# Patient Record
Sex: Female | Born: 1945 | Race: White | Hispanic: No | Marital: Married | Smoking: Never smoker
Health system: Southern US, Community
[De-identification: ages and names within clinical notes are randomized; demographics above are authoritative.]

## PROBLEM LIST (undated history)

## (undated) DIAGNOSIS — E119 Type 2 diabetes mellitus without complications: Secondary | ICD-10-CM

## (undated) DIAGNOSIS — H8309 Labyrinthitis, unspecified ear: Secondary | ICD-10-CM

## (undated) DIAGNOSIS — E039 Hypothyroidism, unspecified: Secondary | ICD-10-CM

## (undated) DIAGNOSIS — I10 Essential (primary) hypertension: Secondary | ICD-10-CM

## (undated) DIAGNOSIS — G4730 Sleep apnea, unspecified: Secondary | ICD-10-CM

## (undated) DIAGNOSIS — K5790 Diverticulosis of intestine, part unspecified, without perforation or abscess without bleeding: Secondary | ICD-10-CM

## (undated) DIAGNOSIS — M810 Age-related osteoporosis without current pathological fracture: Secondary | ICD-10-CM

## (undated) HISTORY — PX: OTHER SURGICAL HISTORY: SHX169

## (undated) HISTORY — PX: TONSILLECTOMY: SUR1361

---

## 1971-12-23 HISTORY — PX: BREAST BIOPSY: SHX20

## 1994-12-22 HISTORY — PX: THYROIDECTOMY, PARTIAL: SHX18

## 2004-10-18 ENCOUNTER — Ambulatory Visit: Payer: Self-pay | Admitting: Gastroenterology

## 2005-09-27 ENCOUNTER — Emergency Department: Payer: Self-pay | Admitting: Emergency Medicine

## 2005-09-27 ENCOUNTER — Other Ambulatory Visit: Payer: Self-pay

## 2005-09-30 ENCOUNTER — Ambulatory Visit: Payer: Self-pay | Admitting: Unknown Physician Specialty

## 2006-07-24 ENCOUNTER — Ambulatory Visit: Payer: Self-pay | Admitting: Unknown Physician Specialty

## 2006-10-15 ENCOUNTER — Ambulatory Visit: Payer: Self-pay | Admitting: Unknown Physician Specialty

## 2007-10-19 ENCOUNTER — Ambulatory Visit: Payer: Self-pay | Admitting: Unknown Physician Specialty

## 2008-10-26 ENCOUNTER — Ambulatory Visit: Payer: Self-pay | Admitting: Unknown Physician Specialty

## 2008-11-03 ENCOUNTER — Ambulatory Visit: Payer: Self-pay | Admitting: Unknown Physician Specialty

## 2009-11-19 ENCOUNTER — Ambulatory Visit: Payer: Self-pay | Admitting: Unknown Physician Specialty

## 2010-10-21 ENCOUNTER — Ambulatory Visit: Payer: Self-pay | Admitting: Unknown Physician Specialty

## 2011-10-23 ENCOUNTER — Ambulatory Visit: Payer: Self-pay | Admitting: Unknown Physician Specialty

## 2012-10-25 ENCOUNTER — Ambulatory Visit: Payer: Self-pay | Admitting: Unknown Physician Specialty

## 2013-10-26 ENCOUNTER — Ambulatory Visit: Payer: Self-pay | Admitting: Internal Medicine

## 2014-12-04 ENCOUNTER — Ambulatory Visit: Payer: Self-pay | Admitting: Internal Medicine

## 2015-07-11 ENCOUNTER — Encounter: Payer: Self-pay | Admitting: *Deleted

## 2015-07-12 ENCOUNTER — Ambulatory Visit: Payer: PPO | Admitting: Anesthesiology

## 2015-07-12 ENCOUNTER — Ambulatory Visit
Admission: RE | Admit: 2015-07-12 | Discharge: 2015-07-12 | Disposition: A | Discharge status: Home / Self Care | Payer: PPO | Source: Ambulatory Visit | Attending: Unknown Physician Specialty | Admitting: Unknown Physician Specialty

## 2015-07-12 ENCOUNTER — Encounter
Admission: RE | Disposition: A | Discharge status: Home / Self Care | Payer: Self-pay | Source: Ambulatory Visit | Attending: Unknown Physician Specialty

## 2015-07-12 DIAGNOSIS — D122 Benign neoplasm of ascending colon: Secondary | ICD-10-CM | POA: Insufficient documentation

## 2015-07-12 DIAGNOSIS — K5730 Diverticulosis of large intestine without perforation or abscess without bleeding: Secondary | ICD-10-CM | POA: Insufficient documentation

## 2015-07-12 DIAGNOSIS — K640 First degree hemorrhoids: Secondary | ICD-10-CM | POA: Diagnosis not present

## 2015-07-12 DIAGNOSIS — Z86010 Personal history of colonic polyps: Secondary | ICD-10-CM | POA: Insufficient documentation

## 2015-07-12 DIAGNOSIS — I10 Essential (primary) hypertension: Secondary | ICD-10-CM | POA: Insufficient documentation

## 2015-07-12 DIAGNOSIS — G4730 Sleep apnea, unspecified: Secondary | ICD-10-CM | POA: Insufficient documentation

## 2015-07-12 DIAGNOSIS — E119 Type 2 diabetes mellitus without complications: Secondary | ICD-10-CM | POA: Diagnosis not present

## 2015-07-12 DIAGNOSIS — E039 Hypothyroidism, unspecified: Secondary | ICD-10-CM | POA: Diagnosis not present

## 2015-07-12 HISTORY — DX: Labyrinthitis, unspecified ear: H83.09

## 2015-07-12 HISTORY — DX: Sleep apnea, unspecified: G47.30

## 2015-07-12 HISTORY — PX: COLONOSCOPY WITH PROPOFOL: SHX5780

## 2015-07-12 HISTORY — DX: Diverticulosis of intestine, part unspecified, without perforation or abscess without bleeding: K57.90

## 2015-07-12 HISTORY — DX: Hypothyroidism, unspecified: E03.9

## 2015-07-12 HISTORY — DX: Type 2 diabetes mellitus without complications: E11.9

## 2015-07-12 HISTORY — DX: Age-related osteoporosis without current pathological fracture: M81.0

## 2015-07-12 HISTORY — DX: Essential (primary) hypertension: I10

## 2015-07-12 LAB — GLUCOSE, CAPILLARY: Glucose-Capillary: 171 mg/dL — ABNORMAL HIGH (ref 65–99)

## 2015-07-12 SURGERY — COLONOSCOPY WITH PROPOFOL
Anesthesia: General

## 2015-07-12 MED ORDER — PROPOFOL INFUSION 10 MG/ML OPTIME
INTRAVENOUS | Status: DC | PRN
  Administered 2015-07-12: 120 ug/kg/min via INTRAVENOUS

## 2015-07-12 MED ORDER — PROPOFOL 10 MG/ML IV BOLUS
INTRAVENOUS | Status: DC | PRN
  Administered 2015-07-12: 40 mg via INTRAVENOUS
  Administered 2015-07-12: 16 mg via INTRAVENOUS

## 2015-07-12 MED ORDER — EPHEDRINE SULFATE 50 MG/ML IJ SOLN
INTRAMUSCULAR | Status: DC | PRN
  Administered 2015-07-12: 5 mg via INTRAVENOUS
  Administered 2015-07-12: 10 mg via INTRAVENOUS

## 2015-07-12 MED ORDER — SODIUM CHLORIDE 0.9 % IV SOLN
INTRAVENOUS | Status: DC
Start: 2015-07-12 — End: 2015-07-12

## 2015-07-12 MED ORDER — FENTANYL CITRATE (PF) 100 MCG/2ML IJ SOLN
INTRAMUSCULAR | Status: DC | PRN
  Administered 2015-07-12: 50 ug via INTRAVENOUS

## 2015-07-12 MED ORDER — PHENYLEPHRINE HCL 10 MG/ML IJ SOLN
INTRAMUSCULAR | Status: DC | PRN
  Administered 2015-07-12 (×3): 100 ug via INTRAVENOUS

## 2015-07-12 MED ORDER — MIDAZOLAM HCL 5 MG/5ML IJ SOLN
INTRAMUSCULAR | Status: DC | PRN
  Administered 2015-07-12: 1 mg via INTRAVENOUS

## 2015-07-12 MED ORDER — SODIUM CHLORIDE 0.9 % IV SOLN
INTRAVENOUS | Status: DC
  Administered 2015-07-12: 1000 mL via INTRAVENOUS

## 2015-07-12 NOTE — Anesthesia Preprocedure Evaluation (Signed)
Anesthesia Evaluation  Patient identified by MRN, date of birth, ID band Patient awake    Reviewed: Allergy & Precautions, NPO status , Patient's Chart, lab work & pertinent test results  History of Anesthesia Complications Negative for: history of anesthetic complications  Airway Mallampati: III       Dental  (+) Teeth Intact   Pulmonary sleep apnea and Continuous Positive Airway Pressure Ventilation ,    Pulmonary exam normal       Cardiovascular hypertension, Pt. on medications Normal cardiovascular exam    Neuro/Psych    GI/Hepatic negative GI ROS,   Endo/Other  diabetes, Well Controlled, Type 2, Oral Hypoglycemic AgentsHypothyroidism   Renal/GU negative Renal ROS     Musculoskeletal   Abdominal Normal abdominal exam  (+)   Peds  Hematology   Anesthesia Other Findings   Reproductive/Obstetrics                             Anesthesia Physical Anesthesia Plan  ASA: III  Anesthesia Plan: General   Post-op Pain Management:    Induction: Intravenous  Airway Management Planned: Nasal Cannula  Additional Equipment:   Intra-op Plan:   Post-operative Plan:   Informed Consent: I have reviewed the patients History and Physical, chart, labs and discussed the procedure including the risks, benefits and alternatives for the proposed anesthesia with the patient or authorized representative who has indicated his/her understanding and acceptance.     Plan Discussed with: CRNA  Anesthesia Plan Comments:         Anesthesia Quick Evaluation

## 2015-07-12 NOTE — Op Note (Signed)
[facility] Patient Name: Jaime Silva Procedure Date: 07/12/2015 7:55 AM MRN: [id number] Account #: [id number] Date of Birth: [birth year] Admit Type: Outpatient Age: 69 Room: [facility] ROOM 1 Gender: Female Note Status: Finalized Procedure:         Colonoscopy Indications:       Personal history of colonic polyps Providers:         Manya Silvas, MD Referring MD:      Glendon Axe (Referring MD) Medicines:         Propofol per Anesthesia Complications:     No immediate complications. Procedure:         Pre-Anesthesia Assessment:                    - After reviewing the risks and benefits, the patient was                     deemed in satisfactory condition to undergo the procedure.                    After obtaining informed consent, the colonoscope was                     passed under direct vision. Throughout the procedure, the                     patient's blood pressure, pulse, and oxygen saturations                     were monitored continuously. The Olympus PCF-H180AL                     colonoscope ( S#: [id number] ) was introduced through the                     anus and advanced to the the cecum, identified by                     appendiceal orifice and ileocecal valve. Findings:      A diminutive polyp was found in the proximal ascending colon. The polyp       was sessile. The polyp was removed with a jumbo cold forceps. Resection       and retrieval were complete.      Many small-mouthed diverticula were found in the sigmoid colon and in       the descending colon.      Internal hemorrhoids were found during endoscopy. The hemorrhoids were       small, medium-sized and Grade I (internal hemorrhoids that do not       prolapse).      The exam was otherwise without abnormality. Impression:        - One diminutive polyp in the proximal ascending colon.                     Resected and retrieved.                    - Diverticulosis  in the sigmoid colon and in the                     descending colon.                    - Internal hemorrhoids.                    -  The examination was otherwise normal. Recommendation:    - Await pathology results. Manya Silvas, MD 07/12/2015 8:35:03 AM This report has been signed electronically. Number of Addenda: 0 Note Initiated On: 07/12/2015 7:55 AM Scope Withdrawal Time: 0 hours 11 minutes 42 seconds  Total Procedure Duration: 0 hours 17 minutes 49 seconds       [facility]

## 2015-07-12 NOTE — Transfer of Care (Signed)
Immediate Anesthesia Transfer of Care Note  Patient: Jaime Silva  Procedure(s) Performed: Procedure(s): COLONOSCOPY WITH PROPOFOL (N/A)  Patient Location: PACU  Anesthesia Type:General  Level of Consciousness: awake, alert , oriented and patient cooperative  Airway & Oxygen Therapy: Patient Spontanous Breathing and Patient connected to nasal cannula oxygen  Post-op Assessment: Report given to RN and Post -op Vital signs reviewed and stable  Post vital signs: Reviewed and stable  Last Vitals:  Filed Vitals:   07/12/15 0838  BP: 104/59  Pulse:   Temp: 36 C  Resp: 18    Complications: No apparent anesthesia complications

## 2015-07-12 NOTE — Anesthesia Postprocedure Evaluation (Signed)
  Anesthesia Post-op Note  Patient: Jaime Silva  Procedure(s) Performed: Procedure(s): COLONOSCOPY WITH PROPOFOL (N/A)  Anesthesia type:General  Patient location: PACU  Post pain: Pain level controlled  Post assessment: Post-op Vital signs reviewed, Patient's Cardiovascular Status Stable, Respiratory Function Stable, Patent Airway and No signs of Nausea or vomiting  Post vital signs: Reviewed and stable  Last Vitals:  Filed Vitals:   07/12/15 0900  BP: 98/67  Pulse: 73  Temp:   Resp: 19    Level of consciousness: awake, alert  and patient cooperative  Complications: No apparent anesthesia complications

## 2015-07-12 NOTE — H&P (Signed)
Primary Care Physician:  Glendon Axe, MD Primary Gastroenterologist:  Dr. Vira Agar  Pre-Procedure History & Physical: HPI:  GURNOOR SLOOP is a 69 y.o. female is here for an colonoscopy.   Past Medical History  Diagnosis Date  . Hypothyroidism   . Hypertension   . Sleep apnea   . Diabetes mellitus without complication   . Labyrinthitis   . Osteoporosis   . Diverticulosis     Past Surgical History  Procedure Laterality Date  . Tonsillectomy    . Fatty tumor removal    . Thyroidectomy, partial  1996  . Septoplasty and turbinate reduction      Prior to Admission medications   Medication Sig Start Date End Date Taking? Authorizing Provider  b complex vitamins tablet Take 1 tablet by mouth daily.   Yes Historical Provider, MD  cloNIDine (CATAPRES) 0.2 MG tablet Take 0.2 mg by mouth 2 (two) times daily.   Yes Historical Provider, MD  glucosamine-chondroitin 500-400 MG tablet Take 1 tablet by mouth 3 (three) times daily.   Yes Historical Provider, MD  levothyroxine (SYNTHROID, LEVOTHROID) 112 MCG tablet Take 112 mcg by mouth daily before breakfast.   Yes Historical Provider, MD  meclizine (ANTIVERT) 12.5 MG tablet Take 12.5 mg by mouth 3 (three) times daily as needed for dizziness.   Yes Historical Provider, MD  metFORMIN (GLUCOPHAGE) 1000 MG tablet Take 1,000 mg by mouth 2 (two) times daily with a meal.   Yes Historical Provider, MD  omega-3 acid ethyl esters (LOVAZA) 1 G capsule Take by mouth 2 (two) times daily.   Yes Historical Provider, MD  raloxifene (EVISTA) 60 MG tablet Take 60 mg by mouth daily.   Yes Historical Provider, MD  senna (SENOKOT) 8.6 MG tablet Take 2 tablets by mouth daily.   Yes Historical Provider, MD  valsartan-hydrochlorothiazide (DIOVAN-HCT) 320-12.5 MG per tablet Take 1 tablet by mouth daily.   Yes Historical Provider, MD  vitamin B-12 (CYANOCOBALAMIN) 1000 MCG tablet 1,000 mcg daily.   Yes Historical Provider, MD    Allergies as of 06/19/2015  .  (Not on File)    History reviewed. No pertinent family history.  History   Social History  . Marital Status: Married    Spouse Name: N/A  . Number of Children: N/A  . Years of Education: N/A   Occupational History  . Not on file.   Social History Main Topics  . Smoking status: Never Smoker   . Smokeless tobacco: Not on file  . Alcohol Use: No  . Drug Use: No  . Sexual Activity: Not on file   Other Topics Concern  . Not on file   Social History Narrative    Review of Systems: See HPI, otherwise negative ROS  Physical Exam: BP 128/88 mmHg  Pulse 79  Temp(Src) 97.9 F (36.6 C) (Tympanic)  Resp 17  Ht 5' 4.75" (1.645 m)  Wt 90.719 kg (200 lb)  BMI 33.52 kg/m2  SpO2 97% General:   Alert,  pleasant and cooperative in NAD Head:  Normocephalic and atraumatic. Neck:  Supple; no masses or thyromegaly. Lungs:  Clear throughout to auscultation.    Heart:  Regular rate and rhythm. Abdomen:  Soft, nontender and nondistended. Normal bowel sounds, without guarding, and without rebound.   Neurologic:  Alert and  oriented x4;  grossly normal neurologically.  Impression/Plan: DEYJAH KINDEL is here for an colonoscopy to be performed for personal history of colon polyps  Risks, benefits, limitations, and alternatives regarding  colonoscopy  have been reviewed with the patient.  Questions have been answered.  All parties agreeable.   Gaylyn Cheers, MD  07/12/2015, 8:05 AM   Primary Care Physician:  Glendon Axe, MD Primary Gastroenterologist:  Dr. Vira Agar  Pre-Procedure History & Physical: HPI:  SAN RUA is a 69 y.o. female is here for an colonoscopy.   Past Medical History  Diagnosis Date  . Hypothyroidism   . Hypertension   . Sleep apnea   . Diabetes mellitus without complication   . Labyrinthitis   . Osteoporosis   . Diverticulosis     Past Surgical History  Procedure Laterality Date  . Tonsillectomy    . Fatty tumor removal    . Thyroidectomy,  partial  1996  . Septoplasty and turbinate reduction      Prior to Admission medications   Medication Sig Start Date End Date Taking? Authorizing Provider  b complex vitamins tablet Take 1 tablet by mouth daily.   Yes Historical Provider, MD  cloNIDine (CATAPRES) 0.2 MG tablet Take 0.2 mg by mouth 2 (two) times daily.   Yes Historical Provider, MD  glucosamine-chondroitin 500-400 MG tablet Take 1 tablet by mouth 3 (three) times daily.   Yes Historical Provider, MD  levothyroxine (SYNTHROID, LEVOTHROID) 112 MCG tablet Take 112 mcg by mouth daily before breakfast.   Yes Historical Provider, MD  meclizine (ANTIVERT) 12.5 MG tablet Take 12.5 mg by mouth 3 (three) times daily as needed for dizziness.   Yes Historical Provider, MD  metFORMIN (GLUCOPHAGE) 1000 MG tablet Take 1,000 mg by mouth 2 (two) times daily with a meal.   Yes Historical Provider, MD  omega-3 acid ethyl esters (LOVAZA) 1 G capsule Take by mouth 2 (two) times daily.   Yes Historical Provider, MD  raloxifene (EVISTA) 60 MG tablet Take 60 mg by mouth daily.   Yes Historical Provider, MD  senna (SENOKOT) 8.6 MG tablet Take 2 tablets by mouth daily.   Yes Historical Provider, MD  valsartan-hydrochlorothiazide (DIOVAN-HCT) 320-12.5 MG per tablet Take 1 tablet by mouth daily.   Yes Historical Provider, MD  vitamin B-12 (CYANOCOBALAMIN) 1000 MCG tablet 1,000 mcg daily.   Yes Historical Provider, MD    Allergies as of 06/19/2015  . (Not on File)    History reviewed. No pertinent family history.  History   Social History  . Marital Status: Married    Spouse Name: N/A  . Number of Children: N/A  . Years of Education: N/A   Occupational History  . Not on file.   Social History Main Topics  . Smoking status: Never Smoker   . Smokeless tobacco: Not on file  . Alcohol Use: No  . Drug Use: No  . Sexual Activity: Not on file   Other Topics Concern  . Not on file   Social History Narrative    Review of Systems: See HPI,  otherwise negative ROS  Physical Exam: BP 128/88 mmHg  Pulse 79  Temp(Src) 97.9 F (36.6 C) (Tympanic)  Resp 17  Ht 5' 4.75" (1.645 m)  Wt 90.719 kg (200 lb)  BMI 33.52 kg/m2  SpO2 97% General:   Alert,  pleasant and cooperative in NAD Head:  Normocephalic and atraumatic. Neck:  Supple; no masses or thyromegaly. Lungs:  Clear throughout to auscultation.    Heart:  Regular rate and rhythm. Abdomen:  Soft, nontender and nondistended. Normal bowel sounds, without guarding, and without rebound.   Neurologic:  Alert and  oriented x4;  grossly normal neurologically.  Impression/Plan:  TALAH COOKSTON is here for an colonoscopy to be performed for personal history of colon polyps   Risks, benefits, limitations, and alternatives regarding  colonoscopy have been reviewed with the patient.  Questions have been answered.  All parties agreeable.   Gaylyn Cheers, MD  07/12/2015, 8:05 AM   Primary Care Physician:  Glendon Axe, MD Primary Gastroenterologist:  Dr. Vira Agar  Pre-Procedure History & Physical: HPI:  AMMIE WARRICK is a 70 y.o. female is here for an colonoscopy.   Past Medical History  Diagnosis Date  . Hypothyroidism   . Hypertension   . Sleep apnea   . Diabetes mellitus without complication   . Labyrinthitis   . Osteoporosis   . Diverticulosis     Past Surgical History  Procedure Laterality Date  . Tonsillectomy    . Fatty tumor removal    . Thyroidectomy, partial  1996  . Septoplasty and turbinate reduction      Prior to Admission medications   Medication Sig Start Date End Date Taking? Authorizing Provider  b complex vitamins tablet Take 1 tablet by mouth daily.   Yes Historical Provider, MD  cloNIDine (CATAPRES) 0.2 MG tablet Take 0.2 mg by mouth 2 (two) times daily.   Yes Historical Provider, MD  glucosamine-chondroitin 500-400 MG tablet Take 1 tablet by mouth 3 (three) times daily.   Yes Historical Provider, MD  levothyroxine (SYNTHROID, LEVOTHROID)  112 MCG tablet Take 112 mcg by mouth daily before breakfast.   Yes Historical Provider, MD  meclizine (ANTIVERT) 12.5 MG tablet Take 12.5 mg by mouth 3 (three) times daily as needed for dizziness.   Yes Historical Provider, MD  metFORMIN (GLUCOPHAGE) 1000 MG tablet Take 1,000 mg by mouth 2 (two) times daily with a meal.   Yes Historical Provider, MD  omega-3 acid ethyl esters (LOVAZA) 1 G capsule Take by mouth 2 (two) times daily.   Yes Historical Provider, MD  raloxifene (EVISTA) 60 MG tablet Take 60 mg by mouth daily.   Yes Historical Provider, MD  senna (SENOKOT) 8.6 MG tablet Take 2 tablets by mouth daily.   Yes Historical Provider, MD  valsartan-hydrochlorothiazide (DIOVAN-HCT) 320-12.5 MG per tablet Take 1 tablet by mouth daily.   Yes Historical Provider, MD  vitamin B-12 (CYANOCOBALAMIN) 1000 MCG tablet 1,000 mcg daily.   Yes Historical Provider, MD    Allergies as of 06/19/2015  . (Not on File)    History reviewed. No pertinent family history.  History   Social History  . Marital Status: Married    Spouse Name: N/A  . Number of Children: N/A  . Years of Education: N/A   Occupational History  . Not on file.   Social History Main Topics  . Smoking status: Never Smoker   . Smokeless tobacco: Not on file  . Alcohol Use: No  . Drug Use: No  . Sexual Activity: Not on file   Other Topics Concern  . Not on file   Social History Narrative    Review of Systems: See HPI, otherwise negative ROS  Physical Exam: BP 128/88 mmHg  Pulse 79  Temp(Src) 97.9 F (36.6 C) (Tympanic)  Resp 17  Ht 5' 4.75" (1.645 m)  Wt 90.719 kg (200 lb)  BMI 33.52 kg/m2  SpO2 97% General:   Alert,  pleasant and cooperative in NAD Head:  Normocephalic and atraumatic. Neck:  Supple; no masses or thyromegaly. Lungs:  Clear throughout to auscultation.    Heart:  Regular rate and rhythm. Abdomen:  Soft,  nontender and nondistended. Normal bowel sounds, without guarding, and without rebound.    Neurologic:  Alert and  oriented x4;  grossly normal neurologically.  Impression/Plan: AZIYAH PROVENCAL is here for an colonoscopy to be performed for personal hx colon polyps  Risks, benefits, limitations, and alternatives regarding  colonoscopy have been reviewed with the patient.  Questions have been answered.  All parties agreeable.   Gaylyn Cheers, MD  07/12/2015, 8:05 AM   Primary Care Physician:  Glendon Axe, MD Primary Gastroenterologist:  Dr. Vira Agar  Pre-Procedure History & Physical: HPI:  YANNI RUBERG is a 69 y.o. female is here for an colonoscopy.   Past Medical History  Diagnosis Date  . Hypothyroidism   . Hypertension   . Sleep apnea   . Diabetes mellitus without complication   . Labyrinthitis   . Osteoporosis   . Diverticulosis     Past Surgical History  Procedure Laterality Date  . Tonsillectomy    . Fatty tumor removal    . Thyroidectomy, partial  1996  . Septoplasty and turbinate reduction      Prior to Admission medications   Medication Sig Start Date End Date Taking? Authorizing Provider  b complex vitamins tablet Take 1 tablet by mouth daily.   Yes Historical Provider, MD  cloNIDine (CATAPRES) 0.2 MG tablet Take 0.2 mg by mouth 2 (two) times daily.   Yes Historical Provider, MD  glucosamine-chondroitin 500-400 MG tablet Take 1 tablet by mouth 3 (three) times daily.   Yes Historical Provider, MD  levothyroxine (SYNTHROID, LEVOTHROID) 112 MCG tablet Take 112 mcg by mouth daily before breakfast.   Yes Historical Provider, MD  meclizine (ANTIVERT) 12.5 MG tablet Take 12.5 mg by mouth 3 (three) times daily as needed for dizziness.   Yes Historical Provider, MD  metFORMIN (GLUCOPHAGE) 1000 MG tablet Take 1,000 mg by mouth 2 (two) times daily with a meal.   Yes Historical Provider, MD  omega-3 acid ethyl esters (LOVAZA) 1 G capsule Take by mouth 2 (two) times daily.   Yes Historical Provider, MD  raloxifene (EVISTA) 60 MG tablet Take 60 mg by mouth  daily.   Yes Historical Provider, MD  senna (SENOKOT) 8.6 MG tablet Take 2 tablets by mouth daily.   Yes Historical Provider, MD  valsartan-hydrochlorothiazide (DIOVAN-HCT) 320-12.5 MG per tablet Take 1 tablet by mouth daily.   Yes Historical Provider, MD  vitamin B-12 (CYANOCOBALAMIN) 1000 MCG tablet 1,000 mcg daily.   Yes Historical Provider, MD    Allergies as of 06/19/2015  . (Not on File)    History reviewed. No pertinent family history.  History   Social History  . Marital Status: Married    Spouse Name: N/A  . Number of Children: N/A  . Years of Education: N/A   Occupational History  . Not on file.   Social History Main Topics  . Smoking status: Never Smoker   . Smokeless tobacco: Not on file  . Alcohol Use: No  . Drug Use: No  . Sexual Activity: Not on file   Other Topics Concern  . Not on file   Social History Narrative    Review of Systems: See HPI, otherwise negative ROS  Physical Exam: BP 128/88 mmHg  Pulse 79  Temp(Src) 97.9 F (36.6 C) (Tympanic)  Resp 17  Ht 5' 4.75" (1.645 m)  Wt 90.719 kg (200 lb)  BMI 33.52 kg/m2  SpO2 97% General:   Alert,  pleasant and cooperative in NAD Head:  Normocephalic and atraumatic.  Neck:  Supple; no masses or thyromegaly. Lungs:  Clear throughout to auscultation.    Heart:  Regular rate and rhythm. Abdomen:  Soft, nontender and nondistended. Normal bowel sounds, without guarding, and without rebound.   Neurologic:  Alert and  oriented x4;  grossly normal neurologically.  Impression/Plan: BUFFY EHLER is here for an colonoscopy to be performed for personal hx colon polyps  Risks, benefits, limitations, and alternatives regarding  colonoscopy have been reviewed with the patient.  Questions have been answered.  All parties agreeable.   Gaylyn Cheers, MD  07/12/2015, 8:05 AM

## 2015-07-13 LAB — SURGICAL PATHOLOGY

## 2015-07-16 ENCOUNTER — Encounter: Payer: Self-pay | Admitting: Unknown Physician Specialty

## 2015-11-30 ENCOUNTER — Other Ambulatory Visit: Payer: Self-pay | Admitting: Internal Medicine

## 2015-11-30 DIAGNOSIS — Z1239 Encounter for other screening for malignant neoplasm of breast: Secondary | ICD-10-CM

## 2015-12-11 ENCOUNTER — Ambulatory Visit
Admission: RE | Admit: 2015-12-11 | Discharge: 2015-12-11 | Disposition: A | Discharge status: Home / Self Care | Payer: PPO | Source: Ambulatory Visit | Attending: Internal Medicine | Admitting: Internal Medicine

## 2015-12-11 DIAGNOSIS — Z1231 Encounter for screening mammogram for malignant neoplasm of breast: Secondary | ICD-10-CM | POA: Diagnosis present

## 2015-12-11 DIAGNOSIS — Z1239 Encounter for other screening for malignant neoplasm of breast: Secondary | ICD-10-CM

## 2016-03-05 DIAGNOSIS — E538 Deficiency of other specified B group vitamins: Secondary | ICD-10-CM | POA: Diagnosis not present

## 2016-03-05 DIAGNOSIS — I10 Essential (primary) hypertension: Secondary | ICD-10-CM | POA: Diagnosis not present

## 2016-03-05 DIAGNOSIS — E119 Type 2 diabetes mellitus without complications: Secondary | ICD-10-CM | POA: Diagnosis not present

## 2016-03-10 DIAGNOSIS — E119 Type 2 diabetes mellitus without complications: Secondary | ICD-10-CM | POA: Diagnosis not present

## 2016-07-24 DIAGNOSIS — L259 Unspecified contact dermatitis, unspecified cause: Secondary | ICD-10-CM | POA: Diagnosis not present

## 2016-09-29 DIAGNOSIS — E119 Type 2 diabetes mellitus without complications: Secondary | ICD-10-CM | POA: Diagnosis not present

## 2016-11-25 DIAGNOSIS — E119 Type 2 diabetes mellitus without complications: Secondary | ICD-10-CM | POA: Diagnosis not present

## 2016-12-01 ENCOUNTER — Other Ambulatory Visit: Payer: Self-pay | Admitting: Internal Medicine

## 2016-12-01 DIAGNOSIS — Z1239 Encounter for other screening for malignant neoplasm of breast: Secondary | ICD-10-CM

## 2016-12-01 DIAGNOSIS — I10 Essential (primary) hypertension: Secondary | ICD-10-CM | POA: Diagnosis not present

## 2016-12-01 DIAGNOSIS — E119 Type 2 diabetes mellitus without complications: Secondary | ICD-10-CM | POA: Diagnosis not present

## 2016-12-01 DIAGNOSIS — Z1231 Encounter for screening mammogram for malignant neoplasm of breast: Secondary | ICD-10-CM | POA: Diagnosis not present

## 2016-12-01 DIAGNOSIS — Z780 Asymptomatic menopausal state: Secondary | ICD-10-CM | POA: Diagnosis not present

## 2016-12-01 DIAGNOSIS — Z23 Encounter for immunization: Secondary | ICD-10-CM | POA: Diagnosis not present

## 2016-12-01 DIAGNOSIS — E039 Hypothyroidism, unspecified: Secondary | ICD-10-CM | POA: Diagnosis not present

## 2016-12-01 DIAGNOSIS — Z0000 Encounter for general adult medical examination without abnormal findings: Secondary | ICD-10-CM | POA: Diagnosis not present

## 2016-12-17 DIAGNOSIS — Z780 Asymptomatic menopausal state: Secondary | ICD-10-CM | POA: Diagnosis not present

## 2017-01-06 ENCOUNTER — Ambulatory Visit
Admission: RE | Admit: 2017-01-06 | Discharge: 2017-01-06 | Disposition: A | Discharge status: Home / Self Care | Payer: PPO | Source: Ambulatory Visit | Attending: Internal Medicine | Admitting: Internal Medicine

## 2017-01-06 DIAGNOSIS — Z1231 Encounter for screening mammogram for malignant neoplasm of breast: Secondary | ICD-10-CM | POA: Diagnosis not present

## 2017-01-06 DIAGNOSIS — Z1239 Encounter for other screening for malignant neoplasm of breast: Secondary | ICD-10-CM

## 2017-06-18 DIAGNOSIS — E669 Obesity, unspecified: Secondary | ICD-10-CM | POA: Diagnosis not present

## 2017-06-18 DIAGNOSIS — M25562 Pain in left knee: Secondary | ICD-10-CM | POA: Diagnosis not present

## 2017-06-18 DIAGNOSIS — M170 Bilateral primary osteoarthritis of knee: Secondary | ICD-10-CM | POA: Diagnosis not present

## 2017-06-18 DIAGNOSIS — E119 Type 2 diabetes mellitus without complications: Secondary | ICD-10-CM | POA: Diagnosis not present

## 2017-06-18 DIAGNOSIS — M25561 Pain in right knee: Secondary | ICD-10-CM | POA: Diagnosis not present

## 2017-06-18 DIAGNOSIS — G8929 Other chronic pain: Secondary | ICD-10-CM | POA: Diagnosis not present

## 2017-06-25 DIAGNOSIS — E119 Type 2 diabetes mellitus without complications: Secondary | ICD-10-CM | POA: Diagnosis not present

## 2017-06-25 DIAGNOSIS — Z0000 Encounter for general adult medical examination without abnormal findings: Secondary | ICD-10-CM | POA: Diagnosis not present

## 2017-06-25 DIAGNOSIS — Z23 Encounter for immunization: Secondary | ICD-10-CM | POA: Diagnosis not present

## 2017-06-25 DIAGNOSIS — I10 Essential (primary) hypertension: Secondary | ICD-10-CM | POA: Diagnosis not present

## 2017-06-25 DIAGNOSIS — E039 Hypothyroidism, unspecified: Secondary | ICD-10-CM | POA: Diagnosis not present

## 2017-09-10 DIAGNOSIS — M170 Bilateral primary osteoarthritis of knee: Secondary | ICD-10-CM | POA: Diagnosis not present

## 2017-09-22 DIAGNOSIS — M1712 Unilateral primary osteoarthritis, left knee: Secondary | ICD-10-CM | POA: Diagnosis not present

## 2017-11-18 DIAGNOSIS — E119 Type 2 diabetes mellitus without complications: Secondary | ICD-10-CM | POA: Diagnosis not present

## 2017-11-18 DIAGNOSIS — I10 Essential (primary) hypertension: Secondary | ICD-10-CM | POA: Diagnosis not present

## 2017-11-18 DIAGNOSIS — E039 Hypothyroidism, unspecified: Secondary | ICD-10-CM | POA: Diagnosis not present

## 2018-03-02 ENCOUNTER — Other Ambulatory Visit: Payer: Self-pay | Admitting: Internal Medicine

## 2018-03-02 DIAGNOSIS — Z1231 Encounter for screening mammogram for malignant neoplasm of breast: Secondary | ICD-10-CM

## 2018-03-18 ENCOUNTER — Ambulatory Visit
Admission: RE | Admit: 2018-03-18 | Discharge: 2018-03-18 | Disposition: A | Discharge status: Home / Self Care | Payer: Medicare HMO | Source: Ambulatory Visit | Attending: Internal Medicine | Admitting: Internal Medicine

## 2018-03-18 DIAGNOSIS — Z1231 Encounter for screening mammogram for malignant neoplasm of breast: Secondary | ICD-10-CM | POA: Diagnosis not present

## 2018-07-22 ENCOUNTER — Encounter: Admission: RE | Admit: 2018-07-22 | Payer: PPO | Source: Ambulatory Visit | Admitting: Internal Medicine

## 2019-02-07 ENCOUNTER — Other Ambulatory Visit: Payer: Self-pay | Admitting: Internal Medicine

## 2019-02-07 DIAGNOSIS — Z1231 Encounter for screening mammogram for malignant neoplasm of breast: Secondary | ICD-10-CM

## 2019-06-22 DEATH — deceased

## 2019-12-19 ENCOUNTER — Other Ambulatory Visit: Payer: Self-pay | Admitting: Internal Medicine

## 2019-12-19 DIAGNOSIS — Z1231 Encounter for screening mammogram for malignant neoplasm of breast: Secondary | ICD-10-CM

## 2019-12-21 ENCOUNTER — Ambulatory Visit: Payer: PPO

## 2020-04-18 ENCOUNTER — Ambulatory Visit
Admission: RE | Admit: 2020-04-18 | Discharge: 2020-04-18 | Disposition: A | Discharge status: Home / Self Care | Payer: Medicare HMO | Source: Ambulatory Visit | Attending: Internal Medicine | Admitting: Internal Medicine

## 2020-04-18 DIAGNOSIS — Z1231 Encounter for screening mammogram for malignant neoplasm of breast: Secondary | ICD-10-CM

## 2021-06-21 ENCOUNTER — Emergency Department
Admission: EM | Admit: 2021-06-21 | Discharge: 2021-06-21 | Disposition: A | Discharge status: Home / Self Care | Payer: Medicare HMO | Attending: Student in an Organized Health Care Education/Training Program | Admitting: Student in an Organized Health Care Education/Training Program

## 2021-06-21 ENCOUNTER — Emergency Department: Payer: Medicare HMO

## 2021-06-21 ENCOUNTER — Other Ambulatory Visit: Payer: Self-pay

## 2021-06-21 DIAGNOSIS — Z8589 Personal history of malignant neoplasm of other organs and systems: Secondary | ICD-10-CM | POA: Diagnosis not present

## 2021-06-21 DIAGNOSIS — Z7984 Long term (current) use of oral hypoglycemic drugs: Secondary | ICD-10-CM | POA: Insufficient documentation

## 2021-06-21 DIAGNOSIS — Z79899 Other long term (current) drug therapy: Secondary | ICD-10-CM | POA: Diagnosis not present

## 2021-06-21 DIAGNOSIS — K5732 Diverticulitis of large intestine without perforation or abscess without bleeding: Secondary | ICD-10-CM | POA: Diagnosis not present

## 2021-06-21 DIAGNOSIS — K5792 Diverticulitis of intestine, part unspecified, without perforation or abscess without bleeding: Secondary | ICD-10-CM

## 2021-06-21 DIAGNOSIS — I10 Essential (primary) hypertension: Secondary | ICD-10-CM | POA: Diagnosis not present

## 2021-06-21 DIAGNOSIS — E039 Hypothyroidism, unspecified: Secondary | ICD-10-CM | POA: Diagnosis not present

## 2021-06-21 DIAGNOSIS — R1030 Lower abdominal pain, unspecified: Secondary | ICD-10-CM | POA: Diagnosis present

## 2021-06-21 DIAGNOSIS — E119 Type 2 diabetes mellitus without complications: Secondary | ICD-10-CM | POA: Diagnosis not present

## 2021-06-21 LAB — CBC
HCT: 36.9 % (ref 36.0–46.0)
Hemoglobin: 11.9 g/dL — ABNORMAL LOW (ref 12.0–15.0)
MCH: 29.2 pg (ref 26.0–34.0)
MCHC: 32.2 g/dL (ref 30.0–36.0)
MCV: 90.7 fL (ref 80.0–100.0)
Platelets: 266 10*3/uL (ref 150–400)
RBC: 4.07 MIL/uL (ref 3.87–5.11)
RDW: 13.3 % (ref 11.5–15.5)
WBC: 12.6 10*3/uL — ABNORMAL HIGH (ref 4.0–10.5)
nRBC: 0 % (ref 0.0–0.2)

## 2021-06-21 LAB — URINALYSIS, COMPLETE (UACMP) WITH MICROSCOPIC
Bacteria, UA: NONE SEEN
Bilirubin Urine: NEGATIVE
Glucose, UA: NEGATIVE mg/dL
Hgb urine dipstick: NEGATIVE
Ketones, ur: NEGATIVE mg/dL
Leukocytes,Ua: NEGATIVE
Nitrite: NEGATIVE
Protein, ur: NEGATIVE mg/dL
Specific Gravity, Urine: 1.015 (ref 1.005–1.030)
pH: 5 (ref 5.0–8.0)

## 2021-06-21 LAB — BASIC METABOLIC PANEL
Anion gap: 9 (ref 5–15)
BUN: 14 mg/dL (ref 8–23)
CO2: 25 mmol/L (ref 22–32)
Calcium: 9.5 mg/dL (ref 8.9–10.3)
Chloride: 102 mmol/L (ref 98–111)
Creatinine, Ser: 1.05 mg/dL — ABNORMAL HIGH (ref 0.44–1.00)
GFR, Estimated: 56 mL/min — ABNORMAL LOW (ref >60)
Glucose, Bld: 196 mg/dL — ABNORMAL HIGH (ref 70–99)
Potassium: 4.1 mmol/L (ref 3.5–5.1)
Sodium: 136 mmol/L (ref 135–145)

## 2021-06-21 MED ORDER — SODIUM CHLORIDE 0.9 % IV BOLUS
500.0000 mL | Freq: Once | INTRAVENOUS | Status: AC
  Administered 2021-06-21: 500 mL via INTRAVENOUS

## 2021-06-21 MED ORDER — AMOXICILLIN-POT CLAVULANATE 875-125 MG PO TABS: 1.0000 | ORAL_TABLET | Freq: Two times a day (BID) | ORAL | 0 refills | Status: AC

## 2021-06-21 MED ORDER — IOHEXOL 300 MG/ML  SOLN
100.0000 mL | Freq: Once | INTRAMUSCULAR | Status: AC | PRN
  Administered 2021-06-21: 100 mL via INTRAVENOUS
  Filled 2021-06-21: qty 100

## 2021-06-21 MED ORDER — FLUCONAZOLE 150 MG PO TABS: 150.0000 mg | ORAL_TABLET | Freq: Every day | ORAL | 0 refills | Status: AC

## 2021-06-21 NOTE — ED Provider Notes (Signed)
[facility] Emergency Department Provider Note    Event Date/Time   First MD Initiated Contact with Patient 06/21/21 1113     (approximate)  I have reviewed the triage vital signs and the nursing notes.   HISTORY  Chief Complaint Urine Output    HPI NETTA FODGE is a 75 y.o. female below listed past medical history presents to the ER for evaluation of suprapubic and right-sided abdominal pain as well as feeling some urinary urgency and concerned that she is retaining urine and states that she is been drinking quite a bit fluid but not having much urinary output.  Intermittently having some flank discomfort.  No hematuria.  No measured fevers.  No nausea.  No trauma.  No previous abdominal surgeries.  No new medications.  Past Medical History:  Diagnosis Date   Diabetes mellitus without complication ([place])    Diverticulosis    Hypertension    Hypothyroidism    Labyrinthitis    Osteoporosis    Sleep apnea    Family History  Problem Relation Age of Onset   Breast cancer Neg Hx    Past Surgical History:  Procedure Laterality Date   BREAST BIOPSY Left 1973   COLONOSCOPY WITH PROPOFOL N/A 07/12/2015   Procedure: COLONOSCOPY WITH PROPOFOL;  Surgeon: Manya Silvas, MD;  Location: [place];  Service: Endoscopy;  Laterality: N/A;   fatty tumor removal     septoplasty and turbinate reduction     THYROIDECTOMY, PARTIAL  1996   TONSILLECTOMY     There are no problems to display for this patient.     Prior to Admission medications   Medication Sig Start Date End Date Taking? Authorizing Provider  amoxicillin-clavulanate (AUGMENTIN) 875-125 MG tablet Take 1 tablet by mouth 2 (two) times daily for 7 days. 06/21/21 06/28/21 Yes Merlyn Lot, MD  fluconazole (DIFLUCAN) 150 MG tablet Take 1 tablet (150 mg total) by mouth daily. 06/21/21  Yes Merlyn Lot, MD  b complex vitamins tablet Take 1 tablet by mouth daily.    [provider]   cloNIDine (CATAPRES) 0.2 MG tablet Take 0.2 mg by mouth 2 (two) times daily.    [provider]  glucosamine-chondroitin 500-400 MG tablet Take 1 tablet by mouth 3 (three) times daily.    [provider]  levothyroxine (SYNTHROID, LEVOTHROID) 112 MCG tablet Take 112 mcg by mouth daily before breakfast.    [provider]  meclizine (ANTIVERT) 12.5 MG tablet Take 12.5 mg by mouth 3 (three) times daily as needed for dizziness.    [provider]  metFORMIN (GLUCOPHAGE) 1000 MG tablet Take 1,000 mg by mouth 2 (two) times daily with a meal.    [provider]  omega-3 acid ethyl esters (LOVAZA) 1 G capsule Take by mouth 2 (two) times daily.    [provider]  raloxifene (EVISTA) 60 MG tablet Take 60 mg by mouth daily.    [provider]  senna (SENOKOT) 8.6 MG tablet Take 2 tablets by mouth daily.    [provider]  valsartan-hydrochlorothiazide (DIOVAN-HCT) 320-12.5 MG per tablet Take 1 tablet by mouth daily.    [provider]  vitamin B-12 (CYANOCOBALAMIN) 1000 MCG tablet 1,000 mcg daily.    [provider]    Allergies Patient has no known allergies.    Social History Social History   Tobacco Use   Smoking status: Never   Smokeless tobacco: Never  Substance Use Topics   Alcohol use: No  Drug use: No    Review of Systems Patient denies headaches, rhinorrhea, blurry vision, numbness, shortness of breath, chest pain, edema, cough, abdominal pain, nausea, vomiting, diarrhea, dysuria, fevers, rashes or hallucinations unless otherwise stated above in HPI. ____________________________________________   PHYSICAL EXAM:  VITAL SIGNS: Vitals:   06/21/21 1039  BP: 140/86  Pulse: 89  Resp: 18  Temp: 98 F (36.7 C)  SpO2: 97%    Constitutional: Alert and oriented.  Eyes: Conjunctivae are normal.  Head: Atraumatic. Nose: No congestion/rhinnorhea. Mouth/Throat: Mucous membranes are moist.    Neck: No stridor. Painless ROM.  Cardiovascular: Normal rate, regular rhythm. Grossly normal heart sounds.  Good peripheral circulation. Respiratory: Normal respiratory effort.  No retractions. Lungs CTAB. Gastrointestinal: Soft  with mild rlq ttp No distention. No abdominal bruits. No CVA tenderness. Genitourinary:  Musculoskeletal: No lower extremity tenderness nor edema.  No joint effusions. Neurologic:  Normal speech and language. No gross focal neurologic deficits are appreciated. No facial droop Skin:  Skin is warm, dry and intact. No rash noted. Psychiatric: Mood and affect are normal. Speech and behavior are normal.  ____________________________________________   LABS (all labs ordered are listed, but only abnormal results are displayed)  Results for orders placed or performed during the hospital encounter of 06/21/21 (from the past 24 hour(s))  CBC     Status: Abnormal   Collection Time: 06/21/21 10:41 AM  Result Value Ref Range   WBC 12.6 (H) 4.0 - 10.5 K/uL   RBC 4.07 3.87 - 5.11 MIL/uL   Hemoglobin 11.9 (L) 12.0 - 15.0 g/dL   HCT 36.9 36.0 - 46.0 %   MCV 90.7 80.0 - 100.0 fL   MCH 29.2 26.0 - 34.0 pg   MCHC 32.2 30.0 - 36.0 g/dL   RDW 13.3 11.5 - 15.5 %   Platelets 266 150 - 400 K/uL   nRBC 0.0 0.0 - 0.2 %  Basic metabolic panel     Status: Abnormal   Collection Time: 06/21/21 10:41 AM  Result Value Ref Range   Sodium 136 135 - 145 mmol/L   Potassium 4.1 3.5 - 5.1 mmol/L   Chloride 102 98 - 111 mmol/L   CO2 25 22 - 32 mmol/L   Glucose, Bld 196 (H) 70 - 99 mg/dL   BUN 14 8 - 23 mg/dL   Creatinine, Ser 1.05 (H) 0.44 - 1.00 mg/dL   Calcium 9.5 8.9 - 10.3 mg/dL   GFR, Estimated 56 (L) >60 mL/min   Anion gap 9 5 - 15  Urinalysis, Complete w Microscopic     Status: Abnormal   Collection Time: 06/21/21 10:42 AM  Result Value Ref Range   Color, Urine YELLOW (A) YELLOW   APPearance HAZY (A) CLEAR   Specific Gravity, Urine 1.015 1.005 - 1.030   pH 5.0 5.0 - 8.0    Glucose, UA NEGATIVE NEGATIVE mg/dL   Hgb urine dipstick NEGATIVE NEGATIVE   Bilirubin Urine NEGATIVE NEGATIVE   Ketones, ur NEGATIVE NEGATIVE mg/dL   Protein, ur NEGATIVE NEGATIVE mg/dL   Nitrite NEGATIVE NEGATIVE   Leukocytes,Ua NEGATIVE NEGATIVE   RBC / HPF 0-5 0 - 5 RBC/hpf   WBC, UA 0-5 0 - 5 WBC/hpf   Bacteria, UA NONE SEEN NONE SEEN   Squamous Epithelial / LPF 0-5 0 - 5   Mucus PRESENT    Hyaline Casts, UA PRESENT    Non Squamous Epithelial PRESENT (A) NONE SEEN   ____________________________________________ ____________________________________________  RADIOLOGY  I personally reviewed all radiographic images  ordered to evaluate for the above acute complaints and reviewed radiology reports and findings.  These findings were personally discussed with the patient.  Please see medical record for radiology report.  ____________________________________________   PROCEDURES  Procedure(s) performed:  Procedures    Critical Care performed: o ____________________________________________   INITIAL IMPRESSION / ASSESSMENT AND PLAN / ED COURSE  Pertinent labs & imaging results that were available during my care of the patient were reviewed by me and considered in my medical decision making (see chart for details).   DDX: appendicitis, cystitis, stone, pyelo, mass, diverticulitis  KORTNIE STOVALL is a 75 y.o. who presents to the ED with presentation as described above.  Patient presenting with urinary urgency with normal-appearing urine has leukocytosis as well as right lower quadrant abdominal pain is acute.  No signs of urinary retention as her postvoid residual was 38 mL.  Will give IV fluids.  Will order CT scan due to concern for the above differential.  Clinical Course as of 06/21/21 1327  Fri Jun 21, 2021  1327 CT with evidence of acute uncomplicated diverticulitis.  Patient tolerating p.o. well-appearing declining any pain medication or antiemetics.  Does appear stable  for trial of outpatient management.  Discussed signs and symptoms for which she should return to the ER. [PR]    Clinical Course User Index [PR] Merlyn Lot, MD    The patient was evaluated in Emergency Department today for the symptoms described in the history of present illness. He/she was evaluated in the context of the global COVID-19 pandemic, which necessitated consideration that the patient might be at risk for infection with the SARS-CoV-2 virus that causes COVID-19. Institutional protocols and algorithms that pertain to the evaluation of patients at risk for COVID-19 are in a state of rapid change based on information released by regulatory bodies including the CDC and federal and state organizations. These policies and algorithms were followed during the patient's care in the ED.  As part of my medical decision making, I reviewed the following data within the [place] notes reviewed and incorporated, Labs reviewed, notes from prior ED visits and [place] Controlled Substance Database   ____________________________________________   FINAL CLINICAL IMPRESSION(S) / ED DIAGNOSES  Final diagnoses:  Diverticulitis      NEW MEDICATIONS STARTED DURING THIS VISIT:  New Prescriptions   AMOXICILLIN-CLAVULANATE (AUGMENTIN) 875-125 MG TABLET    Take 1 tablet by mouth 2 (two) times daily for 7 days.   FLUCONAZOLE (DIFLUCAN) 150 MG TABLET    Take 1 tablet (150 mg total) by mouth daily.     Note:  This document was prepared using Dragon voice recognition software and may include unintentional dictation errors.    Merlyn Lot, MD 06/21/21 1327

## 2021-06-21 NOTE — ED Triage Notes (Signed)
Pt states she does not feel like she has been emptying her bladder, states this morning she has not urinated or had the urge to urinate. States it has progressed over the past year , she has a routine visit with her PCP in 3 weeks but cant remember the last time she saw her pcp. DENIES any painful urination or odor.

## 2021-09-26 IMAGING — MG DIGITAL SCREENING BILAT W/ TOMO W/ CAD
6 of 10 series · 6 of 30 positions shown · non-contrast
Comparison: Previous exam(s).

CLINICAL DATA: Screening.

EXAM:
DIGITAL SCREENING BILATERAL MAMMOGRAM WITH TOMO AND CAD

[L MLO synth-2D (1 of 2)]
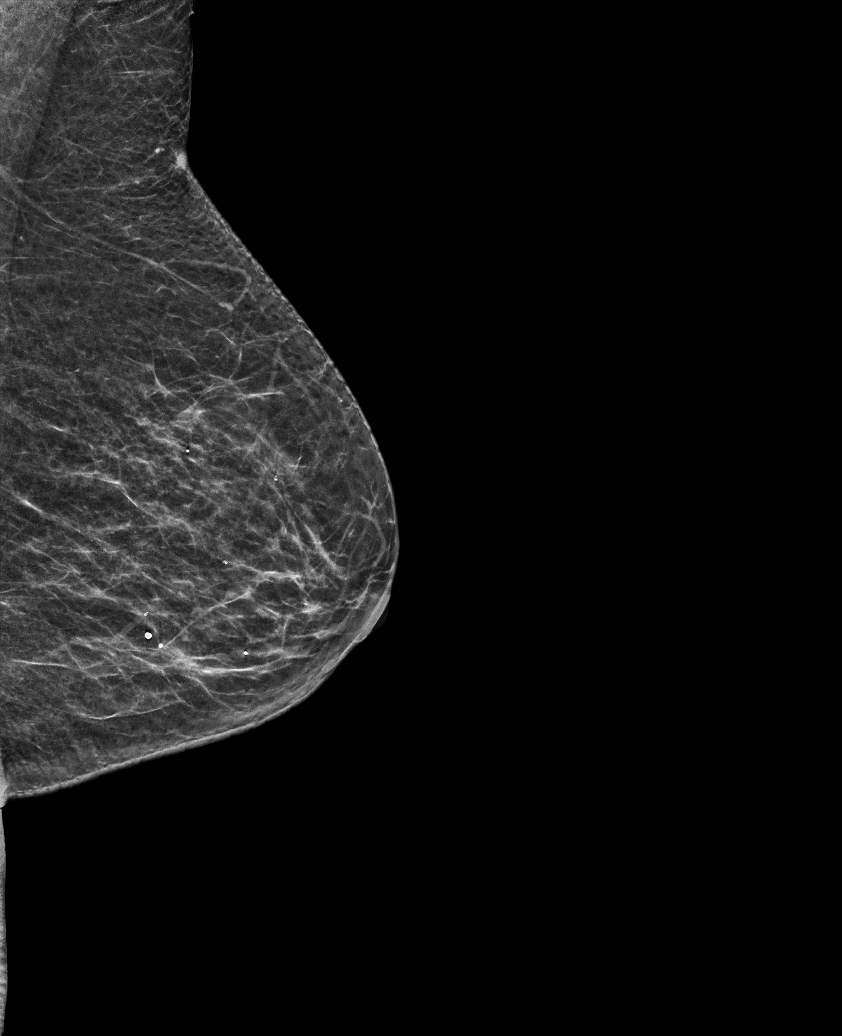

[L MLO synth-2D (2 of 2)]
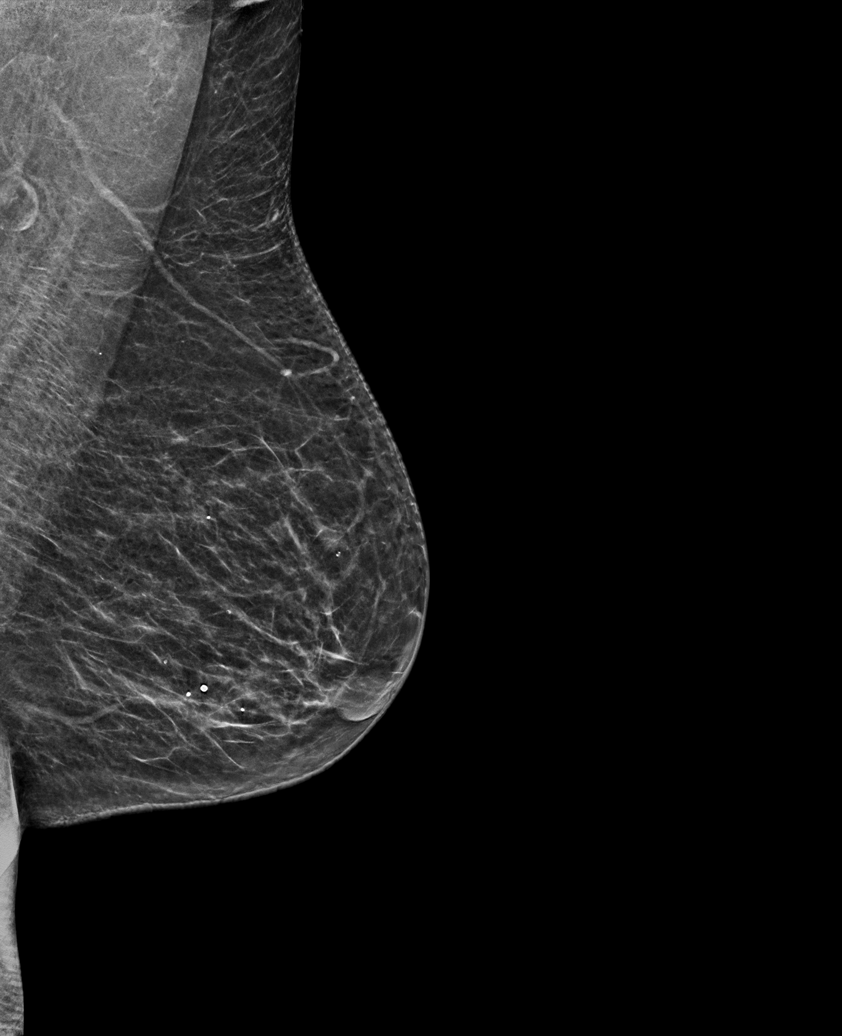

[L CC synth-2D]
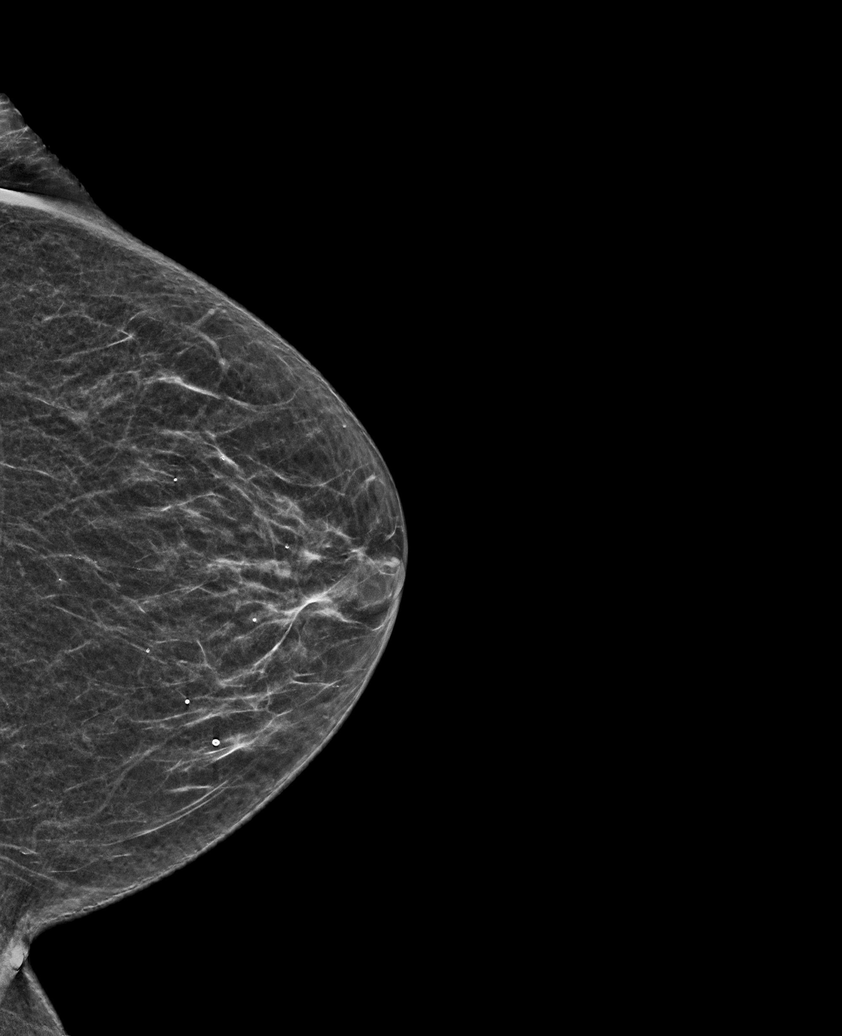

[R CC synth-2D]
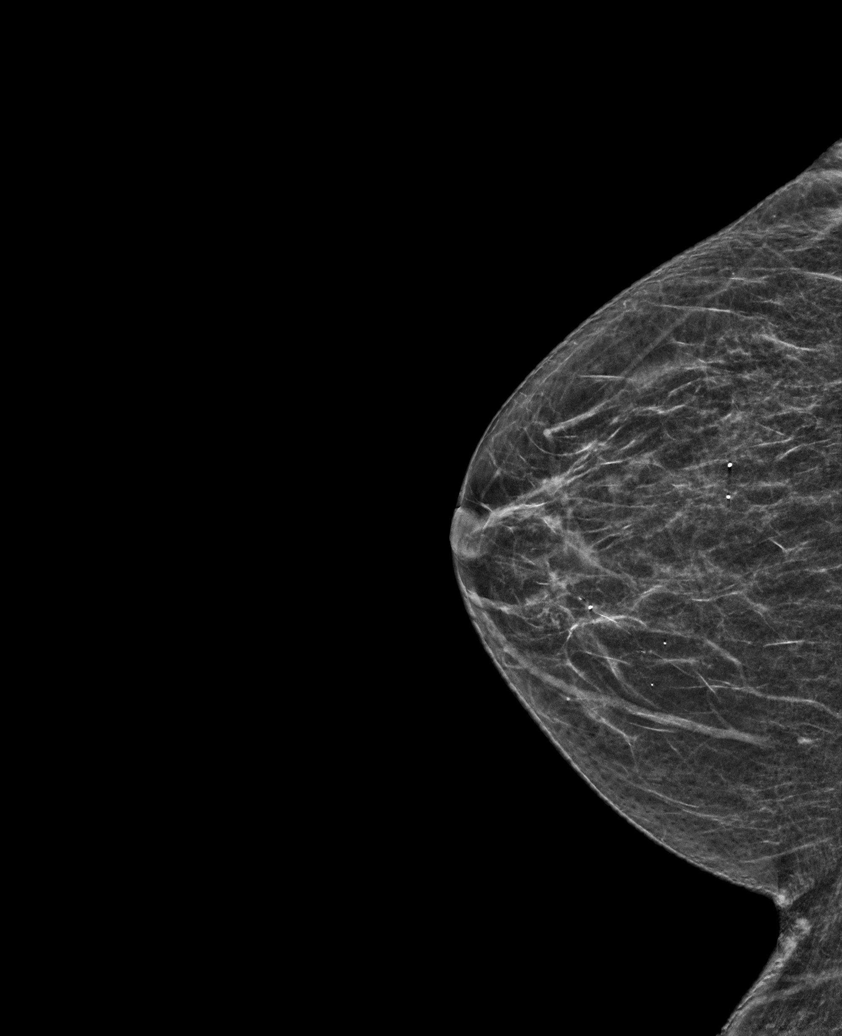

[R MLO synth-2D]
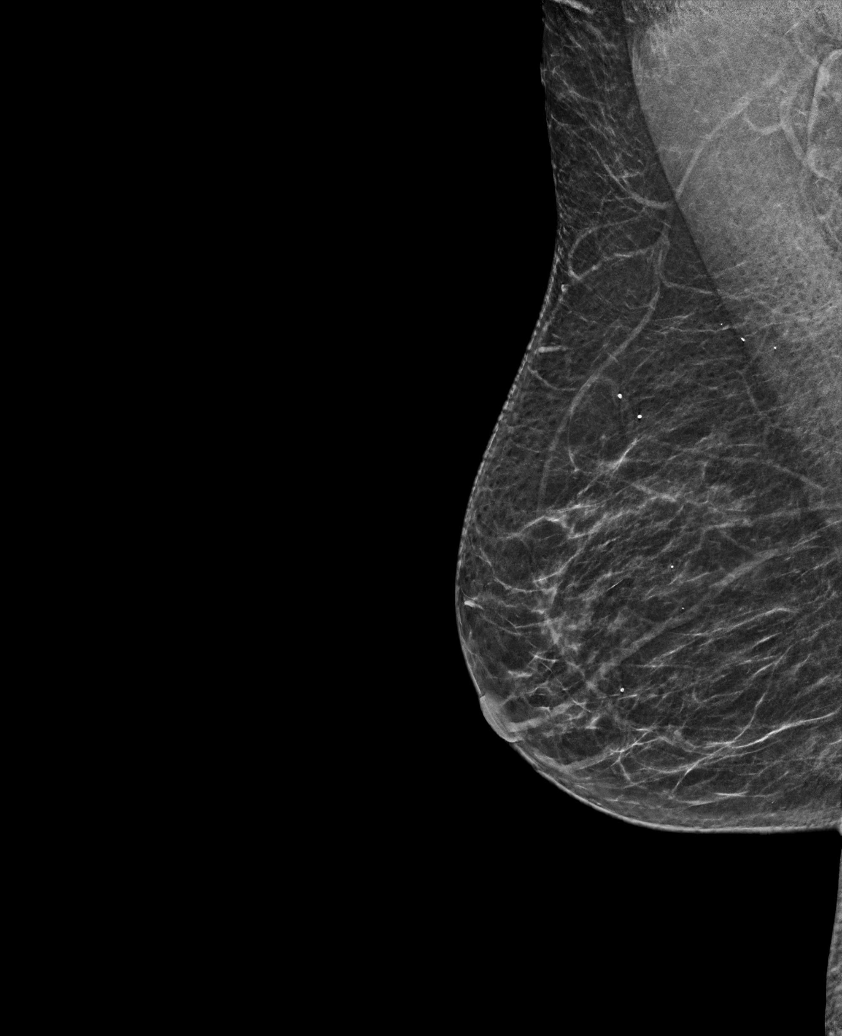

[R CC tomo · tomo slice 27/54.0]
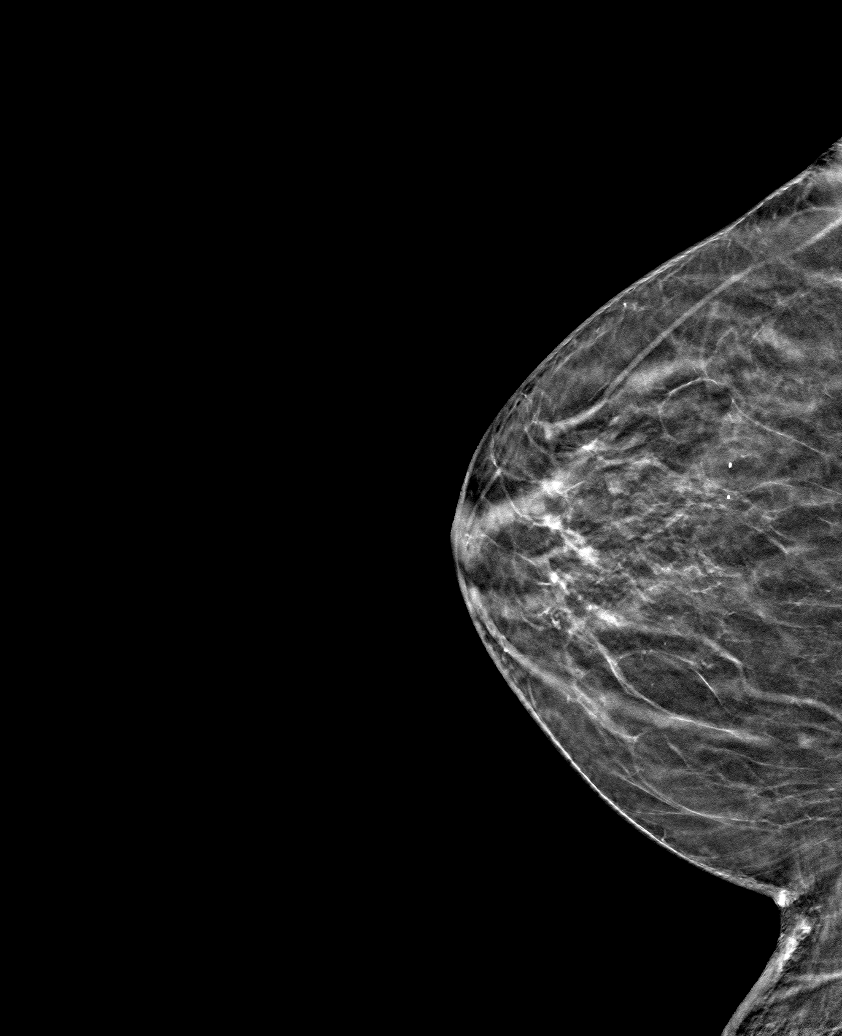

[6 of 30 positions shown; findings below may reference images not displayed]

ACR Breast Density Category b: There are scattered areas of
fibroglandular density.
FINDINGS: There are no findings suspicious for malignancy. Images were
processed with CAD.
IMPRESSION: No mammographic evidence of malignancy. A result letter of this
screening mammogram will be mailed directly to the patient.

RECOMMENDATION:
Screening mammogram in one year. (Code:CN-U-775)

BI-RADS CATEGORY  1: Negative.

## 2021-10-31 ENCOUNTER — Other Ambulatory Visit: Payer: Self-pay | Admitting: Internal Medicine

## 2021-10-31 ENCOUNTER — Other Ambulatory Visit (HOSPITAL_BASED_OUTPATIENT_CLINIC_OR_DEPARTMENT_OTHER): Payer: Self-pay | Admitting: Internal Medicine

## 2021-10-31 DIAGNOSIS — R935 Abnormal findings on diagnostic imaging of other abdominal regions, including retroperitoneum: Secondary | ICD-10-CM

## 2022-11-29 IMAGING — CT CT ABD-PELV W/ CM
2 of 5 series · 16 of 46 positions shown, 18 images · IV contrast (APPLIED)
Comparison: None.

CLINICAL DATA: Abdominal distension.  Unable to urinate.

EXAM:
CT ABDOMEN AND PELVIS WITH CONTRAST
TECHNIQUE: Multidetector CT imaging of the abdomen and pelvis was performed
using the standard protocol following bolus administration of
intravenous contrast.
CONTRAST:  100mL OMNIPAQUE IOHEXOL 300 MG/ML  SOLN

[Series 2: routine abd/pel with · axial · 0.84mm/px · z∈[-888,-458]mm · 13 of 98 slices shown, 15 images]
[im 6/98  soft-tissue]
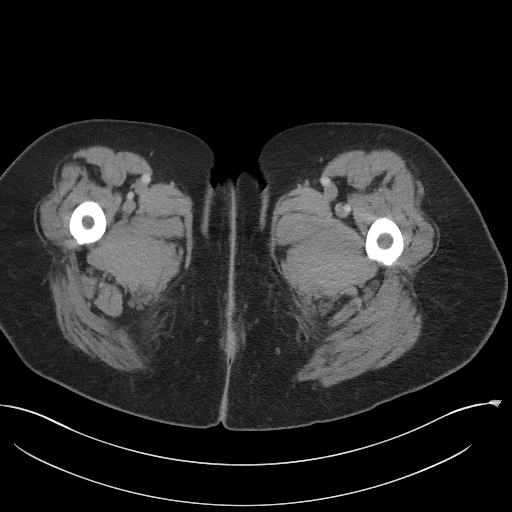
[im 6/98  bone]
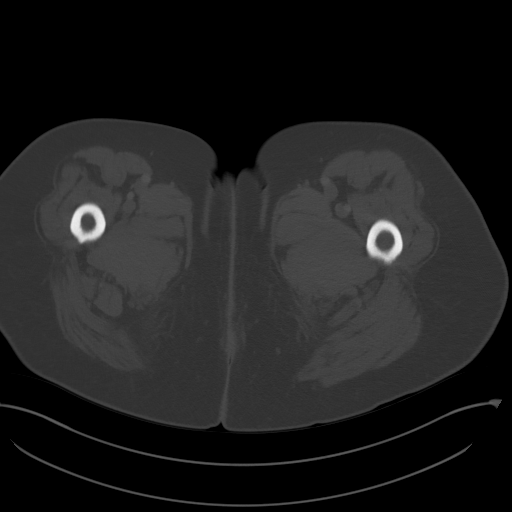
[im 11/98  soft-tissue]
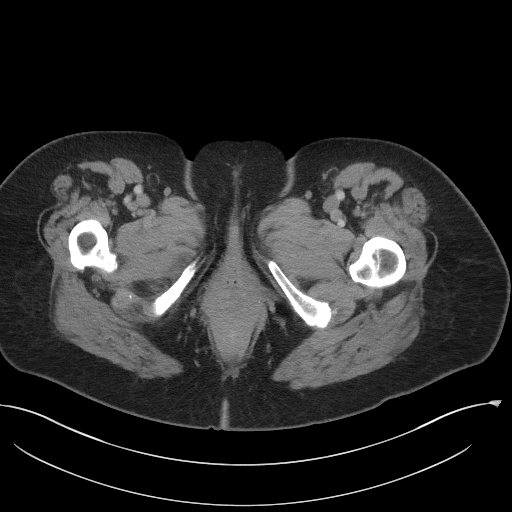
[im 22/98  soft-tissue]
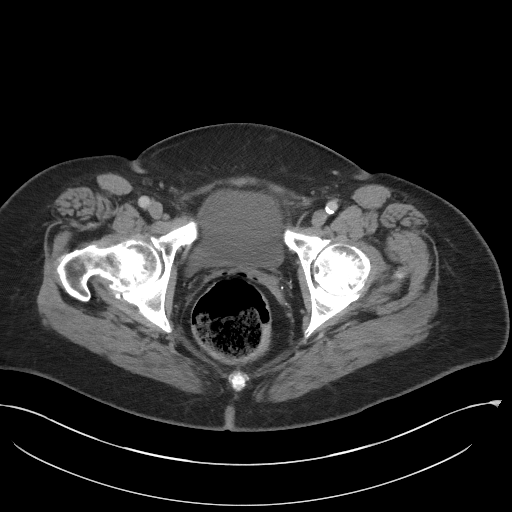
[im 27/98  soft-tissue]
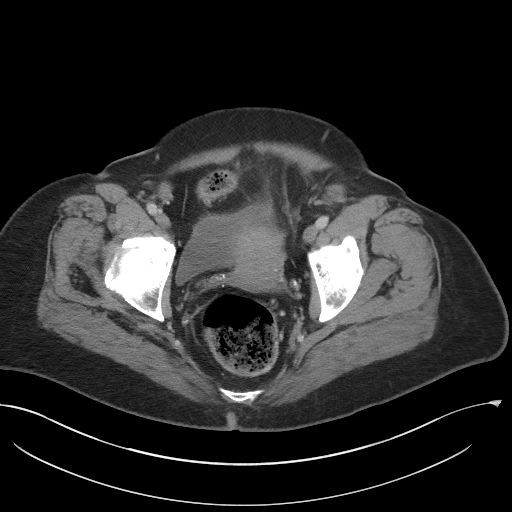
[im 33/98  soft-tissue]
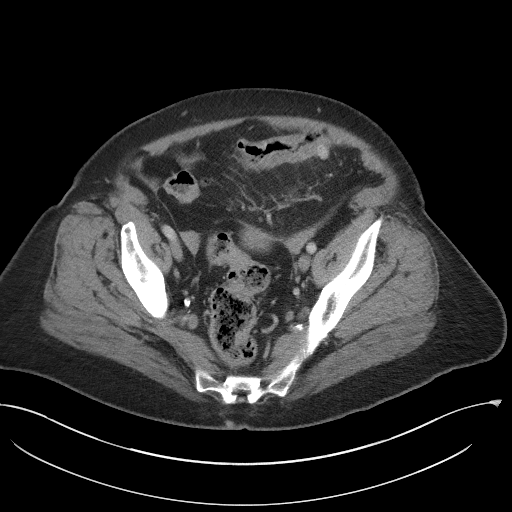
[im 44/98  soft-tissue]
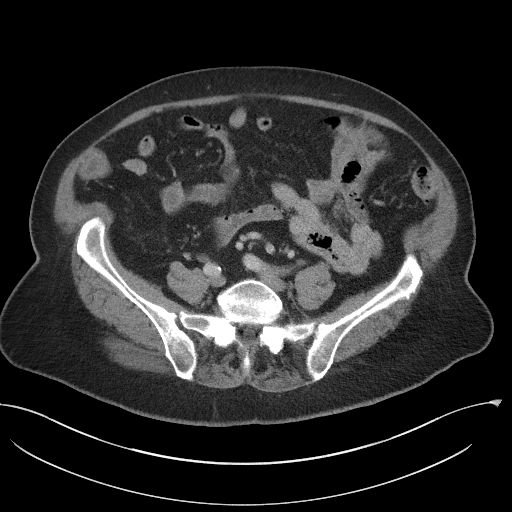
[im 49/98  soft-tissue]
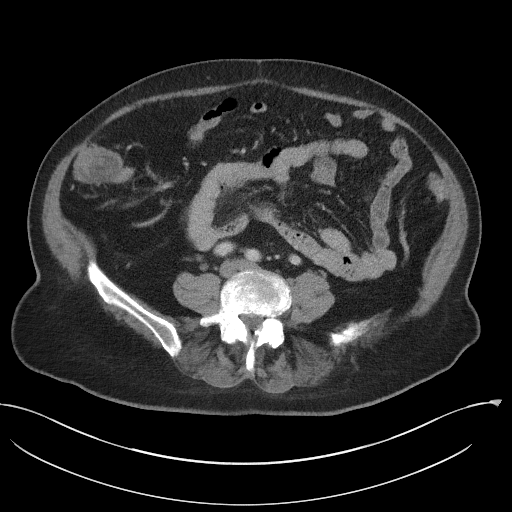
[im 54/98  soft-tissue]
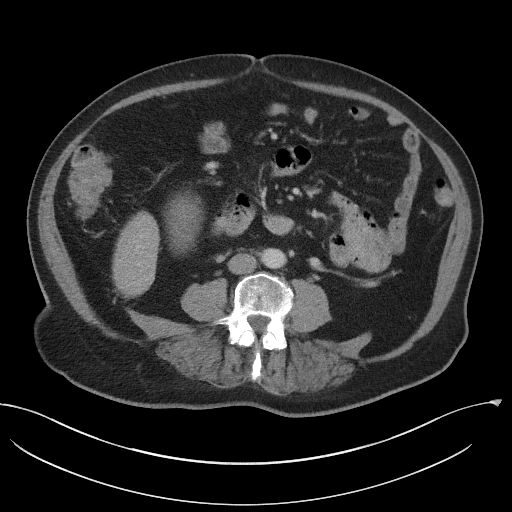
[im 65/98  soft-tissue]
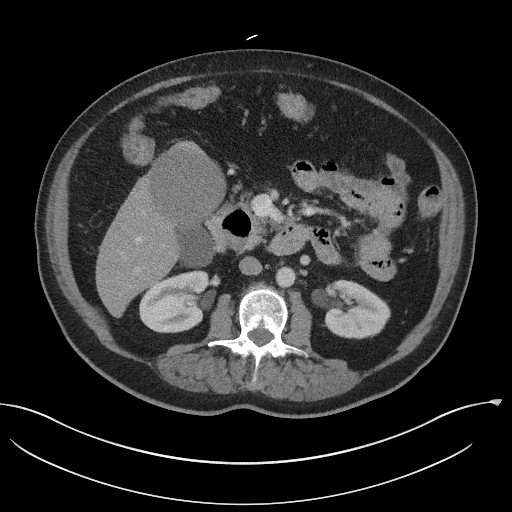
[im 65/98  bone]
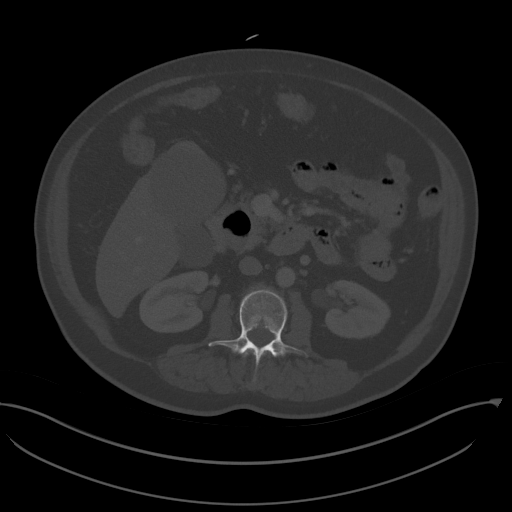
[im 71/98  soft-tissue]
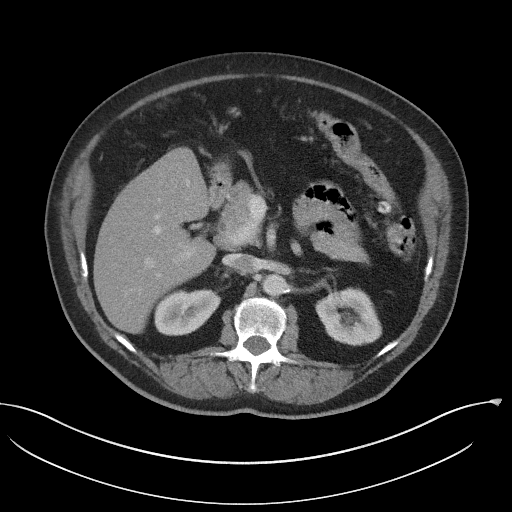
[im 76/98  soft-tissue]
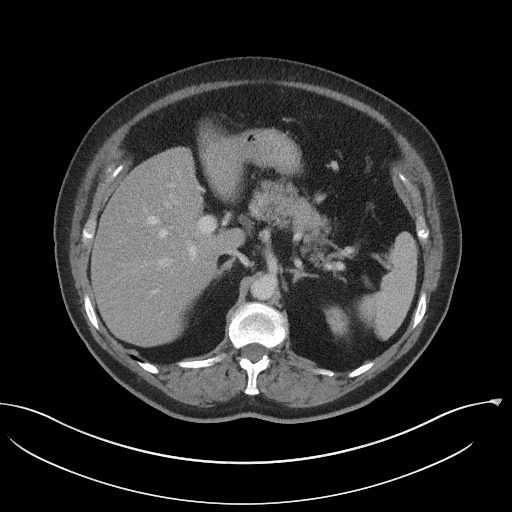
[im 87/98  soft-tissue]
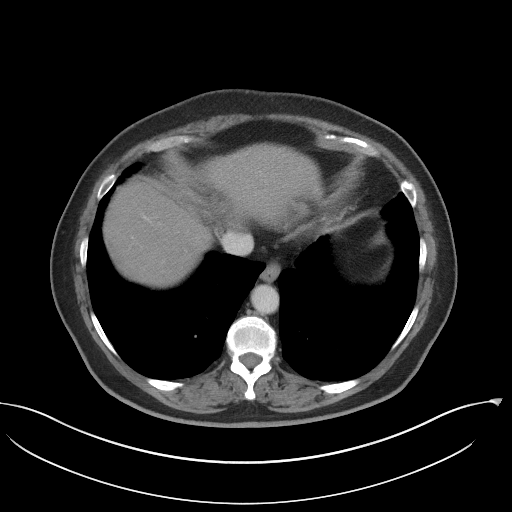
[im 92/98  soft-tissue]
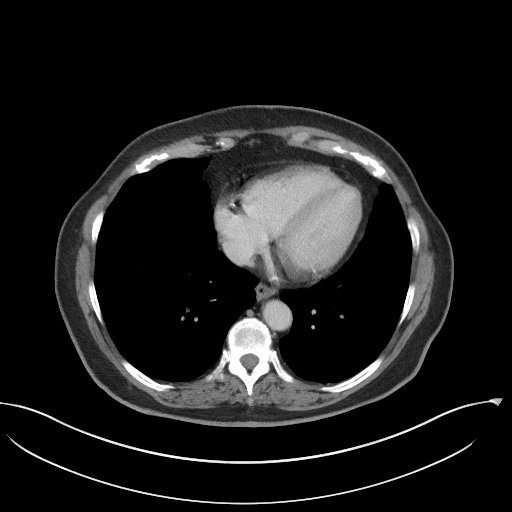

[Series 5: coronal st · coronal · 0.91mm/px · 3 of 109 slices shown]
[im 37/109  soft-tissue]
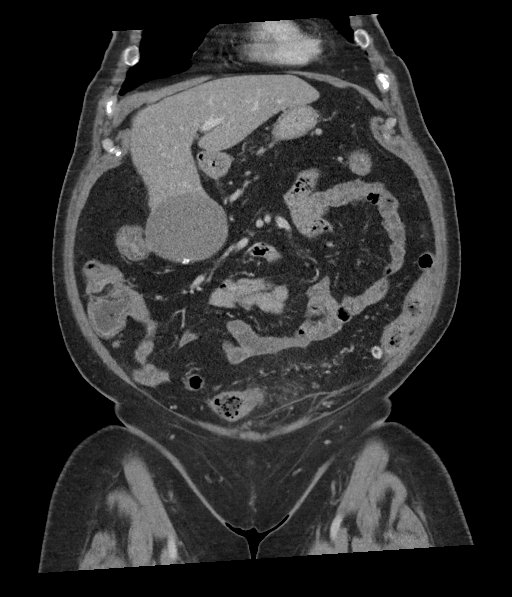
[im 49/109  soft-tissue]
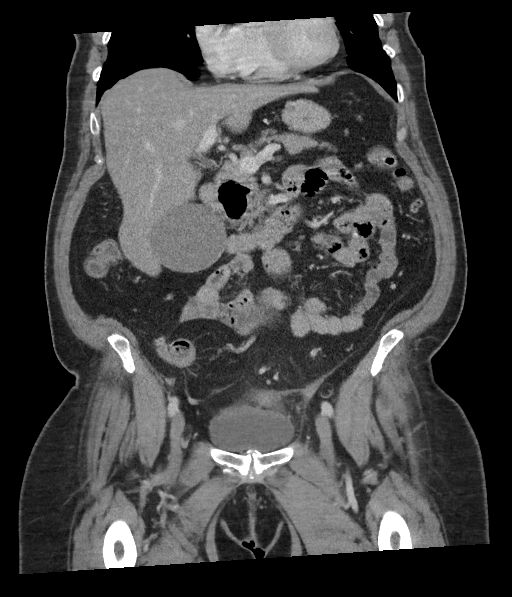
[im 61/109  soft-tissue]
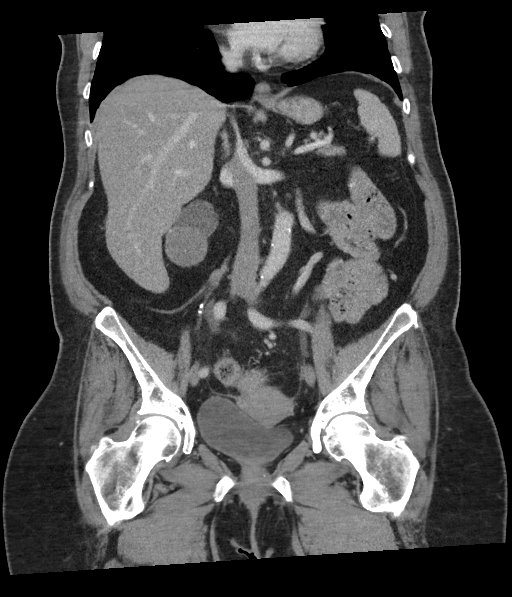

[16 of 46 positions shown; findings below may reference images not displayed]

FINDINGS: Lower chest: No acute abnormality.

Hepatobiliary: Diffusely decreased liver density without focal
abnormality. Markedly distended gallbladder with small stones. No
gallbladder wall thickening or biliary dilatation.

Pancreas: Unremarkable. No pancreatic ductal dilatation or
surrounding inflammatory changes.

Spleen: Normal in size without focal abnormality.

Adrenals/Urinary Tract: Adrenal glands are unremarkable. Small
calculi in the lower pole of the right kidney. No hydronephrosis or
renal mass. The bladder is unremarkable.

Stomach/Bowel: Mild wall thickening of the mid sigmoid colon
adjacent to an inflamed diverticulum (series 2, image 69). The
stomach and small bowel are unremarkable. Duodenal diverticulum.
Normal appendix.

Vascular/Lymphatic: Aortic atherosclerosis. No enlarged abdominal or
pelvic lymph nodes.

Reproductive: Focal endometrial thickening at the fundus measuring
approximately 1 cm (series 6, image 71). Bilateral adnexa are
unremarkable.

Other: No free fluid or pneumoperitoneum.

Musculoskeletal: No acute or significant osseous findings.
IMPRESSION: 1. Acute uncomplicated sigmoid diverticulitis.
2. Focal endometrial thickening at the fundus measuring
approximately 1 cm. Recommend further evaluation with nonemergent
outpatient pelvic ultrasound.
3. Hepatic steatosis.
4. Cholelithiasis and marked gallbladder distension without evidence
of acute cholecystitis.
5. Nonobstructive right nephrolithiasis.
6. Aortic Atherosclerosis ([id number].G).
